# Patient Record
Sex: Male | Born: 1990 | Race: White | Hispanic: No | Marital: Married | Smoking: Current every day smoker
Health system: Southern US, Community
[De-identification: ages and names within clinical notes are randomized; demographics above are authoritative.]

## PROBLEM LIST (undated history)

## (undated) DIAGNOSIS — F1911 Other psychoactive substance abuse, in remission: Secondary | ICD-10-CM

## (undated) DIAGNOSIS — E8801 Alpha-1-antitrypsin deficiency: Secondary | ICD-10-CM

## (undated) HISTORY — DX: Alpha-1-antitrypsin deficiency: E88.01

## (undated) HISTORY — DX: Other psychoactive substance abuse, in remission: F19.11

---

## 2016-08-06 ENCOUNTER — Emergency Department (HOSPITAL_COMMUNITY): Payer: Medicaid Other

## 2016-08-06 ENCOUNTER — Encounter (HOSPITAL_COMMUNITY): Payer: Self-pay

## 2016-08-06 DIAGNOSIS — R05 Cough: Secondary | ICD-10-CM | POA: Diagnosis not present

## 2016-08-06 DIAGNOSIS — F17210 Nicotine dependence, cigarettes, uncomplicated: Secondary | ICD-10-CM | POA: Insufficient documentation

## 2016-08-06 DIAGNOSIS — Z5321 Procedure and treatment not carried out due to patient leaving prior to being seen by health care provider: Secondary | ICD-10-CM | POA: Diagnosis not present

## 2016-08-06 NOTE — ED Triage Notes (Signed)
Patient reports of cough x4 days. Denies fever. Worse when patient lays down and at work. Patient is cook.

## 2016-08-07 ENCOUNTER — Emergency Department (HOSPITAL_COMMUNITY)
Admission: EM | Admit: 2016-08-07 | Discharge: 2016-08-07 | Disposition: A | Discharge status: Home / Self Care | Payer: Medicaid Other | Attending: Dermatology | Admitting: Dermatology

## 2017-04-22 ENCOUNTER — Encounter: Payer: Self-pay | Admitting: Gastroenterology

## 2017-06-19 ENCOUNTER — Encounter: Payer: Self-pay | Admitting: *Deleted

## 2017-06-19 ENCOUNTER — Encounter: Payer: Self-pay | Admitting: Nurse Practitioner

## 2017-06-19 ENCOUNTER — Telehealth: Payer: Self-pay | Admitting: *Deleted

## 2017-06-19 ENCOUNTER — Encounter: Payer: Self-pay | Admitting: Gastroenterology

## 2017-06-19 ENCOUNTER — Ambulatory Visit (INDEPENDENT_AMBULATORY_CARE_PROVIDER_SITE_OTHER): Payer: Medicaid Other | Admitting: Nurse Practitioner

## 2017-06-19 DIAGNOSIS — R768 Other specified abnormal immunological findings in serum: Secondary | ICD-10-CM | POA: Insufficient documentation

## 2017-06-19 DIAGNOSIS — Z87898 Personal history of other specified conditions: Secondary | ICD-10-CM

## 2017-06-19 DIAGNOSIS — F1911 Other psychoactive substance abuse, in remission: Secondary | ICD-10-CM | POA: Insufficient documentation

## 2017-06-19 NOTE — Telephone Encounter (Signed)
Checked [employer]'s website and no PA is needed for elastography.

## 2017-06-19 NOTE — Progress Notes (Signed)
Primary Care Physician:  Tylene Fantasia., PA-C Primary Gastroenterologist:  Dr. Darrick Penna  Chief Complaint  Patient presents with  . Hepatitis C    HPI:   Craig Lester is a 27 y.o. male who presents on referral from primary care for positive hepatitis C antibody.  Reviewed PCP note included with his documentation which is completed 04/04/2017.  At that time he admitted that he tested positive for hepatitis C at the plasma bank in [place].  He admits to former IV drug use but currently undergoing treatment with Suboxone by Dr. Cathey Endow in [place].  He received free hepatitis C and HIV testing but does not have a copy of those results.  He was generally asymptomatic and requested referral to gastroenterology for treatment options.  He indicates his wife is also hepatitis C positive.  No labs included.  Per PCP encounter hepatitis C confirmed by [facility] health department communicable diseases department.  Today he states he's doing well overall. His wife tested positive for HCV but RNA was negative (indicates false positive vs. Spontaneous virus clearing.) He was told he tested HCV+ at Plasma center in [facility]. Denies abdominal pain, N/V, hematochezia, melena, fever, chills, unintentional weight loss. Denies yellowing of skin/eyes, darkened urine, acute episodic confusion, tremors/shakes, LE edema, abdominal swelling. Denies chest pain, dyspnea, dizziness, lightheadedness, syncope, near syncope. Denies any other upper or lower GI symptoms.  He was on Suboxone previously but stopped going because he had stopped using for over a year and felt he no longer needed treatment.  No currently on any medications.  Hepatitis C Risk Factors:  Birth cohort (1944 - 1964): No IV drug use: Yes (previously); last use >1 year ago Tattoos: Yes (all done in "homemade" fashion without sterile technique Blood product transfusion: No HC worker: No Hemodialysis: No Maternal infection:  No  A minimum of 30 minutes was spent with the patient of which at least 50% was spent on education and care coordination.  Past Medical History:  Diagnosis Date  . Alpha-1-antitrypsin deficiency (HCC)    Currently asymptomatic  . History of drug abuse     History reviewed. No pertinent surgical history.  No current outpatient medications on file.   No current facility-administered medications for this visit.     Allergies as of 06/19/2017  . (No Known Allergies)    Family History  Problem Relation Age of Onset  . Colon cancer Neg Hx   . Gastric cancer Neg Hx   . Esophageal cancer Neg Hx   . Liver disease Neg Hx     Social History   Socioeconomic History  . Marital status: Married    Spouse name: Not on file  . Number of children: Not on file  . Years of education: Not on file  . Highest education level: Not on file  Social Needs  . Financial resource strain: Not on file  . Food insecurity - worry: Not on file  . Food insecurity - inability: Not on file  . Transportation needs - medical: Not on file  . Transportation needs - non-medical: Not on file  Occupational History  . Not on file  Tobacco Use  . Smoking status: Current Every Day Smoker    Packs/day: 0.20    Types: Cigarettes  . Smokeless tobacco: Never Used  Substance and Sexual Activity  . Alcohol use: Yes    Comment: once per week: Two 24oz bottles beer  . Drug use: No    Comment: None  since approx 2017  . Sexual activity: Not on file  Other Topics Concern  . Not on file  Social History Narrative  . Not on file    Review of Systems: Complete ROS negative except as per HPI.    Physical Exam: BP (!) 99/59   Pulse (!) 59   Temp (!) 97.3 F (36.3 C) (Oral)   Ht 5\' 4"  (1.626 m)   Wt 125 lb 6.4 oz (56.9 kg)   BMI 21.52 kg/m  General:   Alert and oriented. Pleasant and cooperative. Well-nourished and well-developed.  Eyes:  Without icterus, sclera clear and conjunctiva pink.  Ears:   Normal auditory acuity. Cardiovascular:  S1, S2 present without murmurs appreciated. Extremities without clubbing or edema. Respiratory:  Clear to auscultation bilaterally. No wheezes, rales, or rhonchi. No distress.  Gastrointestinal:  +BS, soft, non-tender and non-distended. No HSM noted. No guarding or rebound. No masses appreciated.  Rectal:  Deferred  Musculoskalatal:  Symmetrical without gross deformities. Neurologic:  Alert and oriented x4;  grossly normal neurologically. Psych:  Alert and cooperative. Normal mood and affect. Heme/Lymph/Immune: No excessive bruising noted.    06/19/2017 2:29 PM   Disclaimer: This note was dictated with voice recognition software. Similar sounding words can inadvertently be transcribed and may not be corrected upon review.

## 2017-06-19 NOTE — Patient Instructions (Signed)
1. Have your blood work drawn when you are able to. 2. We will help schedule your ultrasound of your liver. 3. Return for follow-up in 6-8 weeks. 4. Call if you have any questions or concerns.

## 2017-06-19 NOTE — Assessment & Plan Note (Signed)
History of polysubstance abuse including IV drugs.  He has not had any drugs in over a year.  He was previously treated with Suboxone but is no longer attending the clinic as he feels he does not need further treatment.  Underscored the importance of no longer using IV drugs to prevent reinfection.  He verbalized understanding.

## 2017-06-19 NOTE — Assessment & Plan Note (Signed)
Positive hepatitis C antibody.  His wife has hepatitis C but her RNA test was negative, potentially cleared infection spontaneously.  We will continue with hepatitis C workup on this patient including CBC, CMP, INR, hepatitis C RNA, hepatitis C genotype, hepatitis B core antibody/surface antibody/surface antigen, HIV.  We will also check ultrasound elastography of his liver for fibrosis/cirrhosis.  Return for follow-up in 6-8 weeks.  Pending his labs we can submit for prior Auth of hepatitis C treatment.  He appears to be an intelligent individual and nothing with my discussion with him indicates noncompliance with medication treatment.

## 2017-06-20 NOTE — Progress Notes (Signed)
CC'D TO PCP °

## 2017-06-25 ENCOUNTER — Ambulatory Visit (HOSPITAL_COMMUNITY): Admission: RE | Admit: 2017-06-25 | Payer: Medicaid Other | Source: Ambulatory Visit

## 2017-06-27 ENCOUNTER — Ambulatory Visit (HOSPITAL_COMMUNITY)
Admission: RE | Admit: 2017-06-27 | Discharge: 2017-06-27 | Disposition: A | Discharge status: Home / Self Care | Payer: Medicaid Other | Source: Ambulatory Visit | Attending: Nurse Practitioner | Admitting: Nurse Practitioner

## 2017-06-27 DIAGNOSIS — Z87898 Personal history of other specified conditions: Secondary | ICD-10-CM | POA: Diagnosis present

## 2017-06-27 DIAGNOSIS — R768 Other specified abnormal immunological findings in serum: Secondary | ICD-10-CM | POA: Diagnosis not present

## 2017-06-27 DIAGNOSIS — F1911 Other psychoactive substance abuse, in remission: Secondary | ICD-10-CM

## 2017-06-27 NOTE — Progress Notes (Signed)
Forwarding to [employer]an and Stacey.

## 2017-06-27 NOTE — Progress Notes (Signed)
Pt is aware and would like to see Minerva Areolaric before the end of Feb if at all possible. His appt is for March.

## 2017-07-01 NOTE — Progress Notes (Signed)
Added patient to a waitlist.

## 2017-07-02 NOTE — Progress Notes (Signed)
LMOM for a return call. The genotype and RNA are pending. I spoke to [place] at the lab, customer service. She said sometimes it can take up to 2 weeks for those to finalize.

## 2017-07-02 NOTE — Progress Notes (Signed)
PATIENT IS ON A CANCELLATION LIST

## 2017-07-03 NOTE — Progress Notes (Signed)
Left message for a return call

## 2017-07-09 NOTE — Progress Notes (Signed)
I spoke with Craig Lester in customer service. She said they are saying it resulted on 07/04/2017, but is now showing. She is checking it out and will fax to me, she will call me back if any further problems.

## 2017-07-10 NOTE — Progress Notes (Signed)
Received the Genotype result via fax. Placing on Craig Lester's desk for review.

## 2017-07-11 LAB — COMPREHENSIVE METABOLIC PANEL
AG RATIO: 2.1 (calc) (ref 1.0–2.5)
ALKALINE PHOSPHATASE (APISO): 58 U/L (ref 40–115)
ALT: 23 U/L (ref 9–46)
AST: 28 U/L (ref 10–40)
Albumin: 5.3 g/dL — ABNORMAL HIGH (ref 3.6–5.1)
BILIRUBIN TOTAL: 1.9 mg/dL — AB (ref 0.2–1.2)
BUN: 16 mg/dL (ref 7–25)
CALCIUM: 10.4 mg/dL — AB (ref 8.6–10.3)
CHLORIDE: 105 mmol/L (ref 98–110)
CO2: 28 mmol/L (ref 20–32)
Creat: 0.9 mg/dL (ref 0.60–1.35)
GLOBULIN: 2.5 g/dL (ref 1.9–3.7)
GLUCOSE: 74 mg/dL (ref 65–99)
Potassium: 4.2 mmol/L (ref 3.5–5.3)
Sodium: 140 mmol/L (ref 135–146)
Total Protein: 7.8 g/dL (ref 6.1–8.1)

## 2017-07-11 LAB — PROTIME-INR
INR: 1.1
PROTHROMBIN TIME: 11.2 s (ref 9.0–11.5)

## 2017-07-11 LAB — CBC WITH DIFFERENTIAL/PLATELET
BASOS PCT: 0.8 %
Basophils Absolute: 51 cells/uL (ref 0–200)
EOS PCT: 2 %
Eosinophils Absolute: 128 cells/uL (ref 15–500)
HCT: 40.2 % (ref 38.5–50.0)
Hemoglobin: 13.9 g/dL (ref 13.2–17.1)
Lymphs Abs: 2483 cells/uL (ref 850–3900)
MCH: 29.5 pg (ref 27.0–33.0)
MCHC: 34.6 g/dL (ref 32.0–36.0)
MCV: 85.4 fL (ref 80.0–100.0)
MONOS PCT: 8 %
MPV: 10 fL (ref 7.5–12.5)
NEUTROS PCT: 50.4 %
Neutro Abs: 3226 cells/uL (ref 1500–7800)
PLATELETS: 223 10*3/uL (ref 140–400)
RBC: 4.71 10*6/uL (ref 4.20–5.80)
RDW: 12.8 % (ref 11.0–15.0)
TOTAL LYMPHOCYTE: 38.8 %
WBC mixed population: 512 cells/uL (ref 200–950)
WBC: 6.4 10*3/uL (ref 3.8–10.8)

## 2017-07-11 LAB — HEPATITIS C GENOTYPE

## 2017-07-11 LAB — HCV RNA,QN,PCR W/REFL TO GENOTYPE, LIPA(R)
HCV RNA, PCR, QN (Log): 6.55 log IU/mL — ABNORMAL HIGH
HCV RNA, PCR, QN: 3570000 [IU]/mL — AB

## 2017-07-11 LAB — HEPATITIS C AB W/RFL RNA, PCR + GENO
Hepatitis C Ab: REACTIVE — AB
SIGNAL TO CUT-OFF: 20.4 — ABNORMAL HIGH (ref <1.00)

## 2017-07-11 LAB — HEPATITIS B CORE ANTIBODY, TOTAL: Hep B Core Total Ab: NONREACTIVE

## 2017-07-11 LAB — HEPATITIS B SURFACE ANTIBODY,QUALITATIVE: Hep B S Ab: NONREACTIVE

## 2017-07-11 LAB — HEPATITIS B SURFACE ANTIGEN: Hepatitis B Surface Ag: NONREACTIVE

## 2017-07-11 LAB — HIV ANTIBODY (ROUTINE TESTING W REFLEX): HIV: NONREACTIVE

## 2017-07-11 NOTE — Progress Notes (Signed)
Pt is aware of all results and plan. He will keep appt in early March. I am mailing the order for the Hep B series and he is aware if PCP does not do the shots that [facility].

## 2017-07-17 ENCOUNTER — Encounter: Payer: Self-pay | Admitting: Nurse Practitioner

## 2017-07-17 ENCOUNTER — Ambulatory Visit: Payer: Medicaid Other | Admitting: Nurse Practitioner

## 2017-07-17 VITALS — BP 101/52 | HR 59 | Temp 97.4°F | Ht 65.0 in | Wt 124.4 lb

## 2017-07-17 DIAGNOSIS — Z87898 Personal history of other specified conditions: Secondary | ICD-10-CM

## 2017-07-17 DIAGNOSIS — F1911 Other psychoactive substance abuse, in remission: Secondary | ICD-10-CM

## 2017-07-17 DIAGNOSIS — B182 Chronic viral hepatitis C: Secondary | ICD-10-CM | POA: Diagnosis not present

## 2017-07-17 DIAGNOSIS — Z8639 Personal history of other endocrine, nutritional and metabolic disease: Secondary | ICD-10-CM

## 2017-07-17 NOTE — Progress Notes (Signed)
Referring Provider: Tylene Fantasia., PA-C Primary Care Physician:  Tylene Fantasia., PA-C Primary GI:  Dr. Darrick Penna  Chief Complaint  Patient presents with  . Hepatitis C    HPI:   Craig Lester is a 27 y.o. male who presents for 1 hepatitis C.  The patient was last seen in our office 06/19/2017 for positive hepatitis C and history of drug abuse.  His last visit he admitted former IV drug use but currently undergoing treatment with Suboxone.  His wife is also hepatitis C positive.  At his last visit he was doing well overall, his wife tested positive for hepatitis C antibody but RNA was negative indicating false positive versus spontaneous viral clearing.  His positive antibody was at the plasma center in [place].  Denies any overt GI symptoms or hepatic symptoms.  Was previously on Suboxone but stopped going because he had stopped using for over 1 year and felt he no longer needed treatment.  Recommended serological workup, right upper quadrant ultrasound elastography, follow-up in 6-8 weeks.  Labs are completed 06/27/2017.  CBC was normal, CMP demonstrated elevated bilirubin at 1.9 otherwise normal LFTs, kidney function, electrolytes.  INR normal at 1.1.  HIV negative.  Hepatitis B surface antibody, core antibody, surface antigen all negative indicating susceptible to hepatitis B infection.  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  U/S elastography completed 06/27/17 which found F0/F1 (low fisk of fibrosis). Commended keep follow-up appointment to discuss treatment options.  Today he states he's doing well overall. Deniss abdominal pain, N/V, hematochezia, melena, unintentional weight loss, fever, chills. Denies yellowing of skin/eyes, darkened urine, acute episodic confusion, tremors/shakes. Last IV drug use was over a year ago. Occasional ETOH use (2 times a week consisting of with 1-2 beers). Denies chest pain, dyspnea, dizziness,  lightheadedness, syncope, near syncope. Denies any other upper or lower GI symptoms.  History of Alpha-1-Antitrypsin deficiency at a location in [place], can't remember. Was tested at age 40.  Past Medical History:  Diagnosis Date  . Alpha-1-antitrypsin deficiency (HCC)    Currently asymptomatic  . History of drug abuse     History reviewed. No pertinent surgical history.  No current outpatient medications on file.   No current facility-administered medications for this visit.     Allergies as of 07/17/2017  . (No Known Allergies)    Family History  Problem Relation Age of Onset  . Colon cancer Neg Hx   . Gastric cancer Neg Hx   . Esophageal cancer Neg Hx   . Liver disease Neg Hx     Social History   Socioeconomic History  . Marital status: Married    Spouse name: None  . Number of children: None  . Years of education: None  . Highest education level: None  Social Needs  . Financial resource strain: None  . Food insecurity - worry: None  . Food insecurity - inability: None  . Transportation needs - medical: None  . Transportation needs - non-medical: None  Occupational History  . None  Tobacco Use  . Smoking status: Current Every Day Smoker    Packs/day: 0.10    Types: Cigarettes  . Smokeless tobacco: Never Used  . Tobacco comment: Trying to quit smoking  Substance and Sexual Activity  . Alcohol use: Yes    Comment: twice per week  . Drug use: No    Comment: None since approx 2017  . Sexual activity: None  Other Topics  Concern  . None  Social History Narrative  . None    Review of Systems: Complete ROS negative except as per HPI.   Physical Exam: BP (!) 101/52   Pulse (!) 59   Temp (!) 97.4 F (36.3 C) (Oral)   Ht 5\' 5"  (1.651 m)   Wt 124 lb 6.4 oz (56.4 kg)   BMI 20.70 kg/m  General:   Alert and oriented. Pleasant and cooperative. Well-nourished and well-developed.  Eyes:  Without icterus, sclera clear and conjunctiva pink.  Ears:  Normal  auditory acuity. Cardiovascular:  S1, S2 present without murmurs appreciated. Extremities without clubbing or edema. Respiratory:  Clear to auscultation bilaterally. No wheezes, rales, or rhonchi. No distress.  Gastrointestinal:  +BS, soft, non-tender and non-distended. No HSM noted. No guarding or rebound. No masses appreciated.  Rectal:  Deferred  Musculoskalatal:  Symmetrical without gross deformities. Neurologic:  Alert and oriented x4;  grossly normal neurologically. Psych:  Alert and cooperative. Normal mood and affect.    07/17/2017 10:31 AM   Disclaimer: This note was dictated with voice recognition software. Similar sounding words can inadvertently be transcribed and may not be corrected upon review.

## 2017-07-17 NOTE — Assessment & Plan Note (Signed)
The patient's past medical history includes alpha-1 antitrypsin deficiency.  He states he was tested by his mom at an unknown location when he was 27 years old.  They told him he has to deficiency but no symptoms.  His CMP did have elevated bilirubin at 1.9, other liver enzymes within normal limits.  I will recheck his CMP today.  I will also check alpha-1 antitrypsin enzyme levels and genotyping to have definitive answer on his diagnosis.  He may necessitate ongoing liver monitoring pending results.  He may eventually require liver biopsy specialized staining if his symptoms are labs change.  Return for follow-up 4 weeks after starting hepatitis C treatment.

## 2017-07-17 NOTE — Assessment & Plan Note (Signed)
3 of drug use he has been clean for almost 2 years.  Recommend he continue to abstain from drug use.  Recommend abstinence from alcohol as well.  Return for follow-up 4 weeks post initiation of hepatitis C treatment.

## 2017-07-17 NOTE — Assessment & Plan Note (Signed)
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  He is genotype 1A.  Approximately 3.75 million copies.  His CMP was essentially normal other than elevated bilirubin at 1.9.  He does have a history, per the patient, of alpha-1 antitrypsin deficiency.  We will retest for this as per above as it is been approximately 10 years since she was tested as a juvenile.  Records of this testing is not available to us.  Fortunately, his ultrasound elastography demonstrates F0/F1 with minimal to no scarring.  I feel he is not a great candidate for treatment.  We will begin prior authorization for treatment through his insurance and/or patient assistance.  Recommend Harvoni for 12 weeks.  Follow-up in 4 weeks after starting treatment for follow-up on treatment status as well as labs.

## 2017-07-17 NOTE — Patient Instructions (Signed)
1. Have your blood test done when you are able to. 2. We will submit a request for treatment to your insurance. 3. We will work with your insurance and/or the medication company to get your treatment covered. 4. Return for follow-up 4 weeks after you start your treatment.  We will call to schedule this over the phone once her medication is received and you pick it up. 5. Continue to abstain from drugs.  Abstain from alcohol. 6. Call us if you have any questions or concerns.

## 2017-07-17 NOTE — Progress Notes (Signed)
CC'D TO PCP °

## 2017-07-18 ENCOUNTER — Telehealth: Payer: Self-pay

## 2017-07-18 NOTE — Telephone Encounter (Signed)
EG advised to wait until March to start process for Harvoni through pt assistance. His Medicaid will run out at the end of this month and they will not approve for Harvoni. Called and informed pt.

## 2017-07-18 NOTE — Telephone Encounter (Signed)
EG spoke to MedonDebbie with pt assistance. She advised to try Epclusa. EG advised to call pt to get some info. Called pt. He is married. His wife doesn't work. States his Medicaid was good for 1 year and they will be switching to "family planning". He received letter about Medicaid running out but his wife threw it away. Advised pt to call phone number on Medicaid card to see if he can get another letter. Advised him to bring letter to our office and proof of his income (ie tax return, last 2 pay stubs) so we can start paperwork for patient assistance program in March.

## 2017-07-23 LAB — HEPATIC FUNCTION PANEL
AG Ratio: 2.1 (calc) (ref 1.0–2.5)
ALKALINE PHOSPHATASE (APISO): 60 U/L (ref 40–115)
ALT: 21 U/L (ref 9–46)
AST: 23 U/L (ref 10–40)
Albumin: 4.8 g/dL (ref 3.6–5.1)
BILIRUBIN DIRECT: 0.3 mg/dL — AB (ref 0.0–0.2)
BILIRUBIN INDIRECT: 1.1 mg/dL (ref 0.2–1.2)
Globulin: 2.3 g/dL (calc) (ref 1.9–3.7)
TOTAL PROTEIN: 7.1 g/dL (ref 6.1–8.1)
Total Bilirubin: 1.4 mg/dL — ABNORMAL HIGH (ref 0.2–1.2)

## 2017-07-23 LAB — ALPHA-1 ANTITRYPSIN PHENOTYPE: A1 ANTITRYPSIN SER: 81 mg/dL — AB (ref 83–199)

## 2017-08-07 ENCOUNTER — Ambulatory Visit: Payer: Medicaid Other | Admitting: Nurse Practitioner

## 2017-08-22 ENCOUNTER — Other Ambulatory Visit: Payer: Self-pay

## 2017-09-04 ENCOUNTER — Telehealth: Payer: Self-pay | Admitting: Gastroenterology

## 2017-09-04 NOTE — Telephone Encounter (Signed)
[phone] patient called inquiring about when he was supposed to come back in for an office visit, he was also supposed to speak with someone about patient assistance for the medication he was supposed to start

## 2017-09-04 NOTE — Telephone Encounter (Signed)
Forwarding to [place] who was assisting pt with Harvoni.

## 2017-09-05 NOTE — Telephone Encounter (Signed)
Pt assistance forms for Epclusa mailed to pt. Called and informed him. He can bring completed forms and documents to our office or send in mail.

## 2017-09-05 NOTE — Telephone Encounter (Signed)
Pt called office yesterday. Pt assistance paperwork mailed to pt. Called and informed him. He can bring completed paperwork and documents to our office or send in mail.

## 2021-01-06 ENCOUNTER — Emergency Department (HOSPITAL_COMMUNITY): Payer: Self-pay

## 2021-01-06 ENCOUNTER — Encounter (HOSPITAL_COMMUNITY): Payer: Self-pay

## 2021-01-06 ENCOUNTER — Emergency Department (HOSPITAL_COMMUNITY)
Admission: EM | Admit: 2021-01-06 | Discharge: 2021-01-06 | Disposition: A | Discharge status: Home / Self Care | Payer: Self-pay | Attending: Emergency Medicine | Admitting: Emergency Medicine

## 2021-01-06 ENCOUNTER — Other Ambulatory Visit: Payer: Self-pay

## 2021-01-06 DIAGNOSIS — R109 Unspecified abdominal pain: Secondary | ICD-10-CM | POA: Insufficient documentation

## 2021-01-06 DIAGNOSIS — R0781 Pleurodynia: Secondary | ICD-10-CM | POA: Insufficient documentation

## 2021-01-06 DIAGNOSIS — F17210 Nicotine dependence, cigarettes, uncomplicated: Secondary | ICD-10-CM | POA: Insufficient documentation

## 2021-01-06 LAB — COMPREHENSIVE METABOLIC PANEL
ALT: 121 U/L — ABNORMAL HIGH (ref 0–44)
AST: 120 U/L — ABNORMAL HIGH (ref 15–41)
Albumin: 4.7 g/dL (ref 3.5–5.0)
Alkaline Phosphatase: 70 U/L (ref 38–126)
Anion gap: 10 (ref 5–15)
BUN: 10 mg/dL (ref 6–20)
CO2: 24 mmol/L (ref 22–32)
Calcium: 9 mg/dL (ref 8.9–10.3)
Chloride: 105 mmol/L (ref 98–111)
Creatinine, Ser: 1.02 mg/dL (ref 0.61–1.24)
GFR, Estimated: >60 mL/min (ref >60)
Glucose, Bld: 135 mg/dL — ABNORMAL HIGH (ref 70–99)
Potassium: 3.8 mmol/L (ref 3.5–5.1)
Sodium: 139 mmol/L (ref 135–145)
Total Bilirubin: 1.1 mg/dL (ref 0.3–1.2)
Total Protein: 8.6 g/dL — ABNORMAL HIGH (ref 6.5–8.1)

## 2021-01-06 LAB — CBC
HCT: 47.5 % (ref 39.0–52.0)
Hemoglobin: 15.9 g/dL (ref 13.0–17.0)
MCH: 33.3 pg (ref 26.0–34.0)
MCHC: 33.5 g/dL (ref 30.0–36.0)
MCV: 99.4 fL (ref 80.0–100.0)
Platelets: 215 10*3/uL (ref 150–400)
RBC: 4.78 MIL/uL (ref 4.22–5.81)
RDW: 14.5 % (ref 11.5–15.5)
WBC: 6.8 10*3/uL (ref 4.0–10.5)
nRBC: 0 % (ref 0.0–0.2)

## 2021-01-06 LAB — URINALYSIS, ROUTINE W REFLEX MICROSCOPIC
Bilirubin Urine: NEGATIVE
Glucose, UA: NEGATIVE mg/dL
Hgb urine dipstick: NEGATIVE
Ketones, ur: NEGATIVE mg/dL
Leukocytes,Ua: NEGATIVE
Nitrite: NEGATIVE
Protein, ur: NEGATIVE mg/dL
Specific Gravity, Urine: >1.046 — ABNORMAL HIGH (ref 1.005–1.030)
pH: 6 (ref 5.0–8.0)

## 2021-01-06 LAB — TROPONIN I (HIGH SENSITIVITY)
Troponin I (High Sensitivity): 4 ng/L (ref <18)
Troponin I (High Sensitivity): 6 ng/L (ref <18)

## 2021-01-06 MED ORDER — KETOROLAC TROMETHAMINE 15 MG/ML IJ SOLN
15.0000 mg | Freq: Once | INTRAMUSCULAR | Status: AC
  Administered 2021-01-06: 15 mg via INTRAVENOUS
  Filled 2021-01-06: qty 1

## 2021-01-06 MED ORDER — HYDROMORPHONE HCL 1 MG/ML IJ SOLN
0.5000 mg | Freq: Once | INTRAMUSCULAR | Status: AC
  Administered 2021-01-06: 0.5 mg via INTRAVENOUS
  Filled 2021-01-06: qty 1

## 2021-01-06 MED ORDER — SODIUM CHLORIDE 0.9 % IV BOLUS
1000.0000 mL | Freq: Once | INTRAVENOUS | Status: AC
  Administered 2021-01-06: 1000 mL via INTRAVENOUS

## 2021-01-06 MED ORDER — IOHEXOL 350 MG/ML SOLN
100.0000 mL | Freq: Once | INTRAVENOUS | Status: AC | PRN
  Administered 2021-01-06: 100 mL via INTRAVENOUS

## 2021-01-06 MED ORDER — NAPROXEN 500 MG PO TABS: 500.0000 mg | ORAL_TABLET | Freq: Two times a day (BID) | ORAL | 0 refills | Status: AC

## 2021-01-06 NOTE — ED Provider Notes (Signed)
[facility]-EMERGENCY DEPT [facility] Emergency Department Provider Note MRN:  [id number]  Arrival date & time: 01/06/21     Chief Complaint   Flank pain History of Present Illness   Craig Lester is a 30 y.o. year-old male with a history of alpha-1 antitrypsin deficiency presenting to the ED with chief complaint of flank pain.  Patient is endorsing gradual onset left flank pain for the past 24 hours.  Becoming progressively worse, constant, currently 6 out of 10.  Pain is located in the anterior left ribs, left lateral ribs, as well as the left flank.  Pain is much worse with deep breaths.  Denies headache or vision change, no shortness of breath, no abdominal pain, no dysuria, no hematuria, no fever.  Review of Systems  A complete 10 system review of systems was obtained and all systems are negative except as noted in the HPI and PMH.   Patient's Health History    Past Medical History:  Diagnosis Date   Alpha-1-antitrypsin deficiency [facility])    Currently asymptomatic   History of drug abuse (HCC)     History reviewed. No pertinent surgical history.  Family History  Problem Relation Age of Onset   Colon cancer Neg Hx    Gastric cancer Neg Hx    Esophageal cancer Neg Hx    Liver disease Neg Hx     Social History   Socioeconomic History   Marital status: Married    Spouse name: Not on file   Number of children: Not on file   Years of education: Not on file   Highest education level: Not on file  Occupational History   Not on file  Tobacco Use   Smoking status: Every Day    Packs/day: 0.10    Types: Cigarettes   Smokeless tobacco: Never   Tobacco comments:    Trying to quit smoking  Substance and Sexual Activity   Alcohol use: Yes    Comment: twice per week   Drug use: No    Types: IV, Methamphetamines, Heroin    Comment: None since approx 2017   Sexual activity: Not on file  Other Topics Concern   Not on file  Social History Narrative   Not on file    Social Determinants of Health   Financial Resource Strain: Not on file  Food Insecurity: Not on file  Transportation Needs: Not on file  Physical Activity: Not on file  Stress: Not on file  Social Connections: Not on file  Intimate Partner Violence: Not on file     Physical Exam   Vitals:   01/06/21 0330 01/06/21 0400  BP: (!) 134/99 (!) 149/84  Pulse: (!) 113 (!) 103  Resp: (!) 22 19  Temp:    SpO2: 96% 100%    CONSTITUTIONAL: Well-appearing, NAD NEURO:  Alert and oriented x 3, no focal deficits EYES:  eyes equal and reactive ENT/NECK:  no LAD, no JVD CARDIO: Tachycardic rate, well-perfused, normal S1 and S2 PULM:  CTAB no wheezing or rhonchi GI/GU:  normal bowel sounds, non-distended, non-tender; left CVA tenderness MSK/SPINE:  No gross deformities, no edema SKIN:  no rash, atraumatic PSYCH:  Appropriate speech and behavior  *Additional and/or pertinent findings included in MDM below  Diagnostic and Interventional Summary    EKG Interpretation  Date/Time:  Friday January 06 2021 01:57:36 EDT Ventricular Rate:  128 PR Interval:  136 QRS Duration: 80 QT Interval:  303 QTC Calculation: 443 R Axis:   81 Text Interpretation: Sinus  tachycardia No previous ECGs available Confirmed by Kennis Carina [phone]) on 01/06/2021 2:48:46 AM       Labs Reviewed  COMPREHENSIVE METABOLIC PANEL - Abnormal; Notable for the following components:      Result Value   Glucose, Bld 135 (*)    Total Protein 8.6 (*)    AST 120 (*)    ALT 121 (*)    All other components within normal limits  URINALYSIS, ROUTINE W REFLEX MICROSCOPIC - Abnormal; Notable for the following components:   Specific Gravity, Urine >1.046 (*)    All other components within normal limits  CBC  TROPONIN I (HIGH SENSITIVITY)  TROPONIN I (HIGH SENSITIVITY)    CT RENAL STONE STUDY  Final Result    CT Angio Chest Pulmonary Embolism (PE) W or WO Contrast  Final Result    DG Chest Port 1 View  Final Result       Medications  sodium chloride 0.9 % bolus 1,000 mL (0 mLs Intravenous Stopped 01/06/21 0300)  ketorolac (TORADOL) 15 MG/ML injection 15 mg (15 mg Intravenous Given 01/06/21 0213)  HYDROmorphone (DILAUDID) injection 0.5 mg (0.5 mg Intravenous Given 01/06/21 0213)  iohexol (OMNIPAQUE) 350 MG/ML injection 100 mL (100 mLs Intravenous Contrast Given 01/06/21 0250)     Procedures  /  Critical Care Procedures  ED Course and Medical Decision Making  I have reviewed the triage vital signs, the nursing notes, and pertinent available records from the EMR.  Listed above are laboratory and imaging tests that I personally ordered, reviewed, and interpreted and then considered in my medical decision making (see below for details).  Differential diagnosis includes pneumothorax, PE, kidney stone, MSK pain.  Patient denies trauma.  No evidence of shingles.  Doubt splenic pathology.  Given the tachycardia and the abnormal location of the pain, will be difficult to exclude PE without CTA.  Starting with labs, chest x-ray, suspect will proceed with CT imaging.     Work-up is reassuring, labs normal, troponin negative, urinalysis normal, CT imaging without acute process, no kidney stone, no evidence of PE or dissection.  On reassessment patient is feeling much better, tachycardia is resolved.  Suspect MSK related pain or pleurisy, appropriate for discharge.  Elmer Sow. Pilar Plate, MD [facility] Emergency Medicine [facility] [email or web address]  Final Clinical Impressions(s) / ED Diagnoses     ICD-10-CM   1. Flank pain  R10.9       ED Discharge Orders          Ordered    naproxen (NAPROSYN) 500 MG tablet  2 times daily        01/06/21 0446             Discharge Instructions Discussed with and Provided to Patient:     Discharge Instructions      You were evaluated in the Emergency Department and after careful evaluation, we did not find any emergent condition requiring admission  or further testing in the hospital.  Your exam/testing today is overall reassuring.  Symptoms seem to be due to pleurisy.  CT scans did not show any blood clots or kidney stones or any other emergencies.  Recommend taking the Naprosyn anti-inflammatory as directed.  Please return to the Emergency Department if you experience any worsening of your condition.   Thank you for allowing [place] to be a part of your care.        Sabas Sous, MD 01/06/21 [phone]

## 2021-01-06 NOTE — Discharge Instructions (Addendum)
You were evaluated in the Emergency Department and after careful evaluation, we did not find any emergent condition requiring admission or further testing in the hospital.  Your exam/testing today is overall reassuring.  Symptoms seem to be due to pleurisy.  CT scans did not show any blood clots or kidney stones or any other emergencies.  Recommend taking the Naprosyn anti-inflammatory as directed.  Please return to the Emergency Department if you experience any worsening of your condition.   Thank you for allowing [place] to be a part of your care.

## 2021-01-06 NOTE — ED Triage Notes (Signed)
Left side and flank pain since yesterday. Thinks he has a kidney stone. Denies urinary s/s .

## 2022-01-23 IMAGING — CT CT RENAL STONE PROTOCOL
2 of 4 series · 16 of 46 positions shown, 18 images · non-contrast
Comparison: CT of the abdomen pelvis report dated 05/11/2019. The
images are not available for direct comparison.

CLINICAL DATA: Flank pain. Concern for kidney stone.

EXAM:
CT ABDOMEN AND PELVIS WITHOUT CONTRAST
TECHNIQUE: Multidetector CT imaging of the abdomen and pelvis was performed
following the standard protocol without IV contrast.

[Series 3: axial st · axial · 0.86mm/px · z∈[+678,+1098]mm · 13 of 100 slices shown, 15 images]
[im 8/100  soft-tissue]
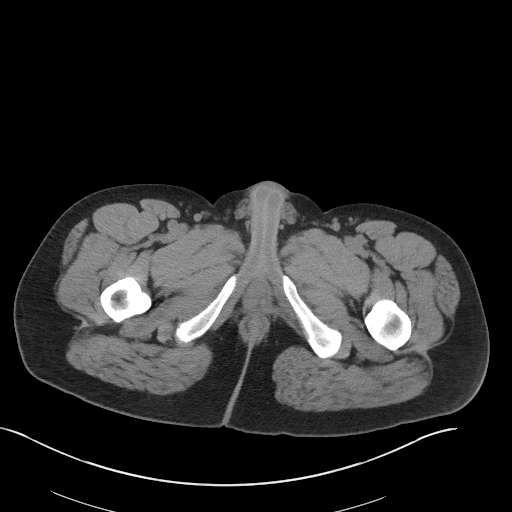
[im 8/100  bone]
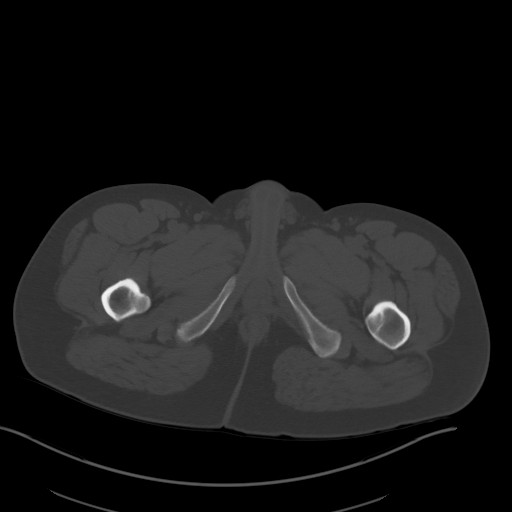
[im 15/100  soft-tissue]
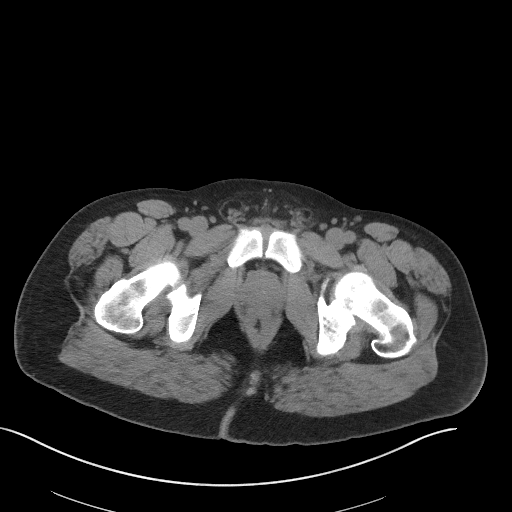
[im 22/100  soft-tissue]
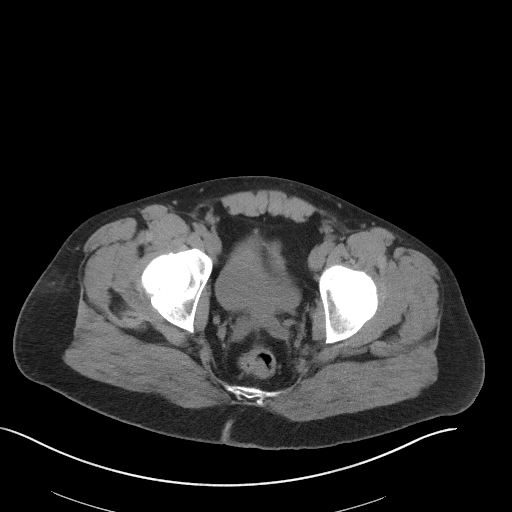
[im 29/100  soft-tissue]
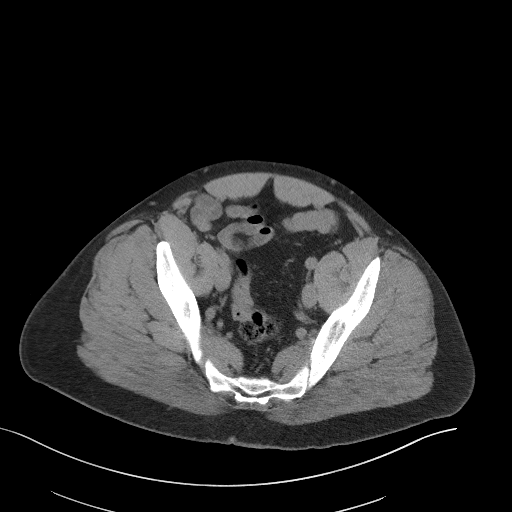
[im 36/100  soft-tissue]
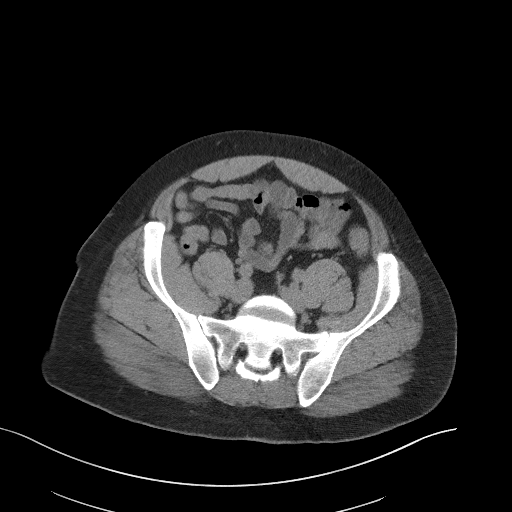
[im 43/100  soft-tissue]
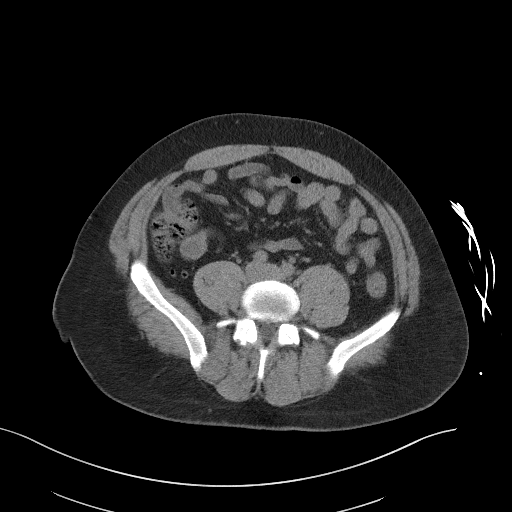
[im 50/100  soft-tissue]
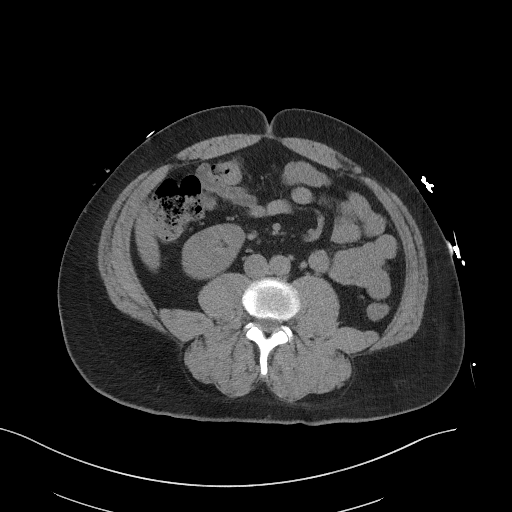
[im 57/100  soft-tissue]
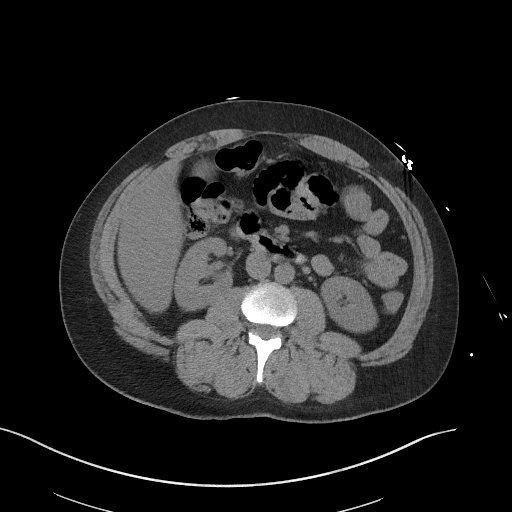
[im 64/100  soft-tissue]
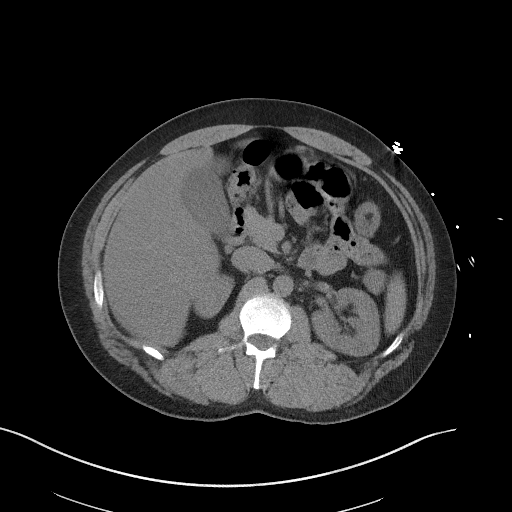
[im 64/100  bone]
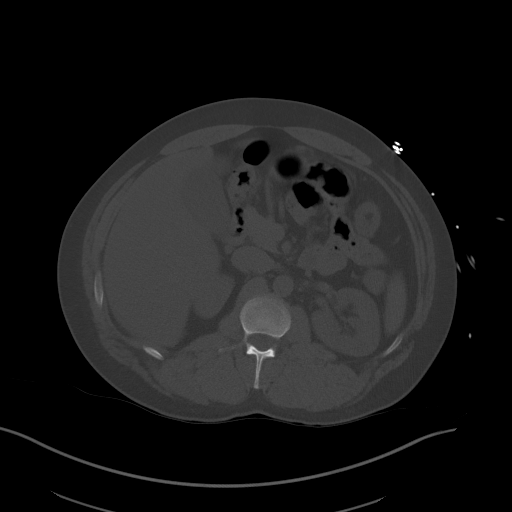
[im 71/100  soft-tissue]
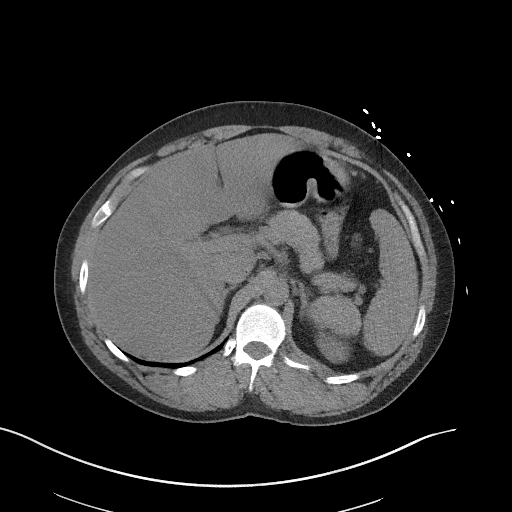
[im 78/100  soft-tissue]
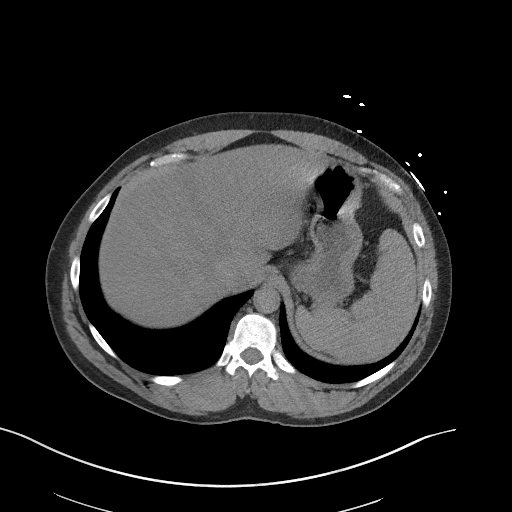
[im 85/100  soft-tissue]
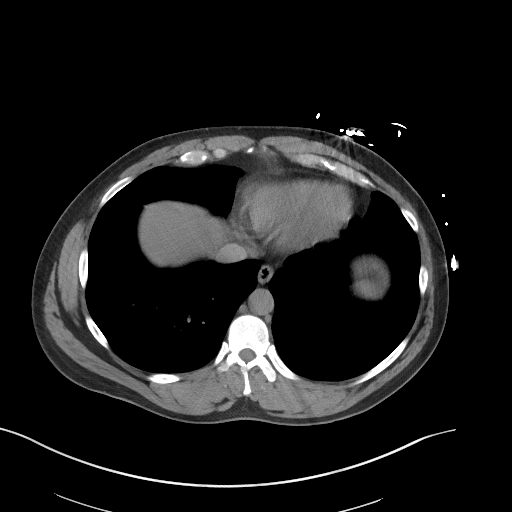
[im 92/100  soft-tissue]
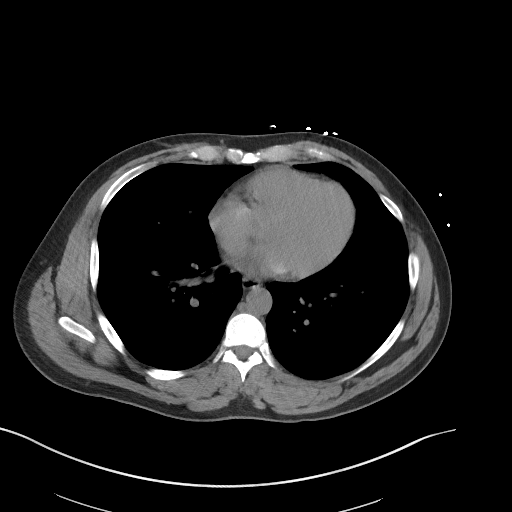

[Series 6: coronal st · coronal · 0.82mm/px · 3 of 101 slices shown]
[im 34/101  soft-tissue]
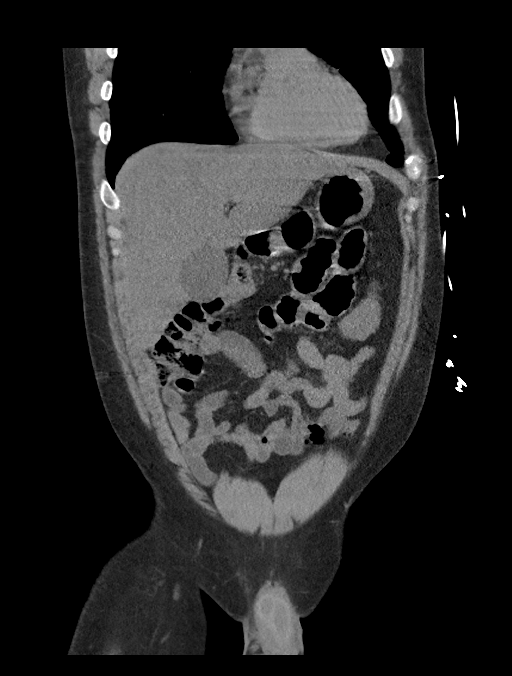
[im 45/101  soft-tissue]
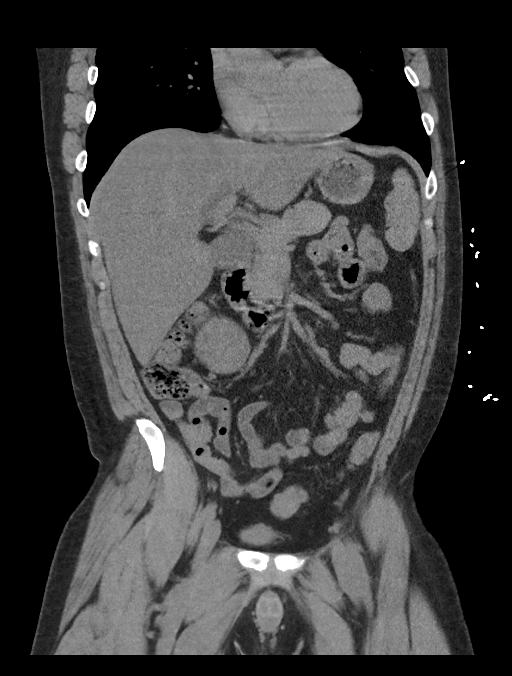
[im 56/101  soft-tissue]
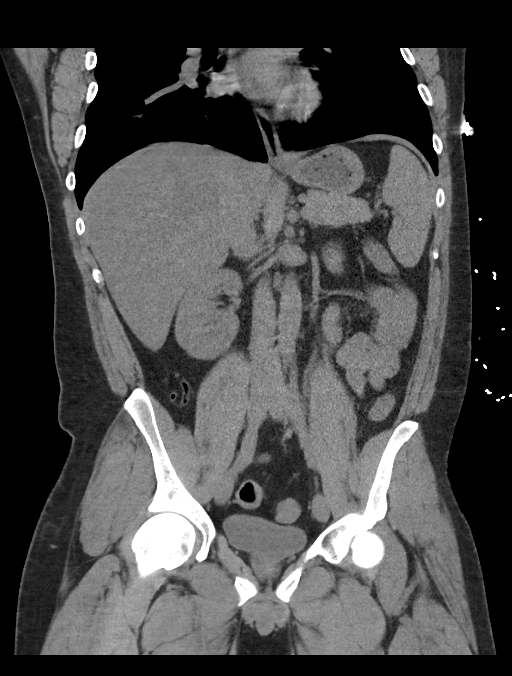

[16 of 46 positions shown; findings below may reference images not displayed]

FINDINGS: Evaluation of this exam is limited in the absence of intravenous
contrast.

Lower chest: The visualized lung bases are clear.

No intra-abdominal free air or free fluid.

Hepatobiliary: Diffuse fatty liver. No intrahepatic biliary ductal
dilatation. The gallbladder is unremarkable.

Pancreas: Unremarkable. No pancreatic ductal dilatation or
surrounding inflammatory changes.

Spleen: Normal in size without focal abnormality.

Adrenals/Urinary Tract: The adrenal glands, kidneys, visualized
ureters, and urinary bladder appear unremarkable.

Stomach/Bowel: There is no bowel obstruction or active inflammation.
The appendix is normal.

Vascular/Lymphatic: The abdominal aorta and IVC are grossly
unremarkable on this noncontrast CT. No portal venous gas. There is
no adenopathy.

Reproductive: The prostate and seminal vesicles are grossly
unremarkable. No pelvic mass.

Other: None

Musculoskeletal: Bilateral L5 pars defects. No listhesis. No acute
osseous pathology.
IMPRESSION: 1. No acute intra-abdominal or pelvic pathology. No hydronephrosis
or nephrolithiasis.
2. Fatty liver.

## 2022-01-23 IMAGING — DX DG CHEST 1V PORT
1 series · 1 of 1 positions shown · non-contrast
Comparison: 08/06/2016

CLINICAL DATA: Left rib pain

EXAM:
PORTABLE CHEST 1 VIEW

[chest ap]
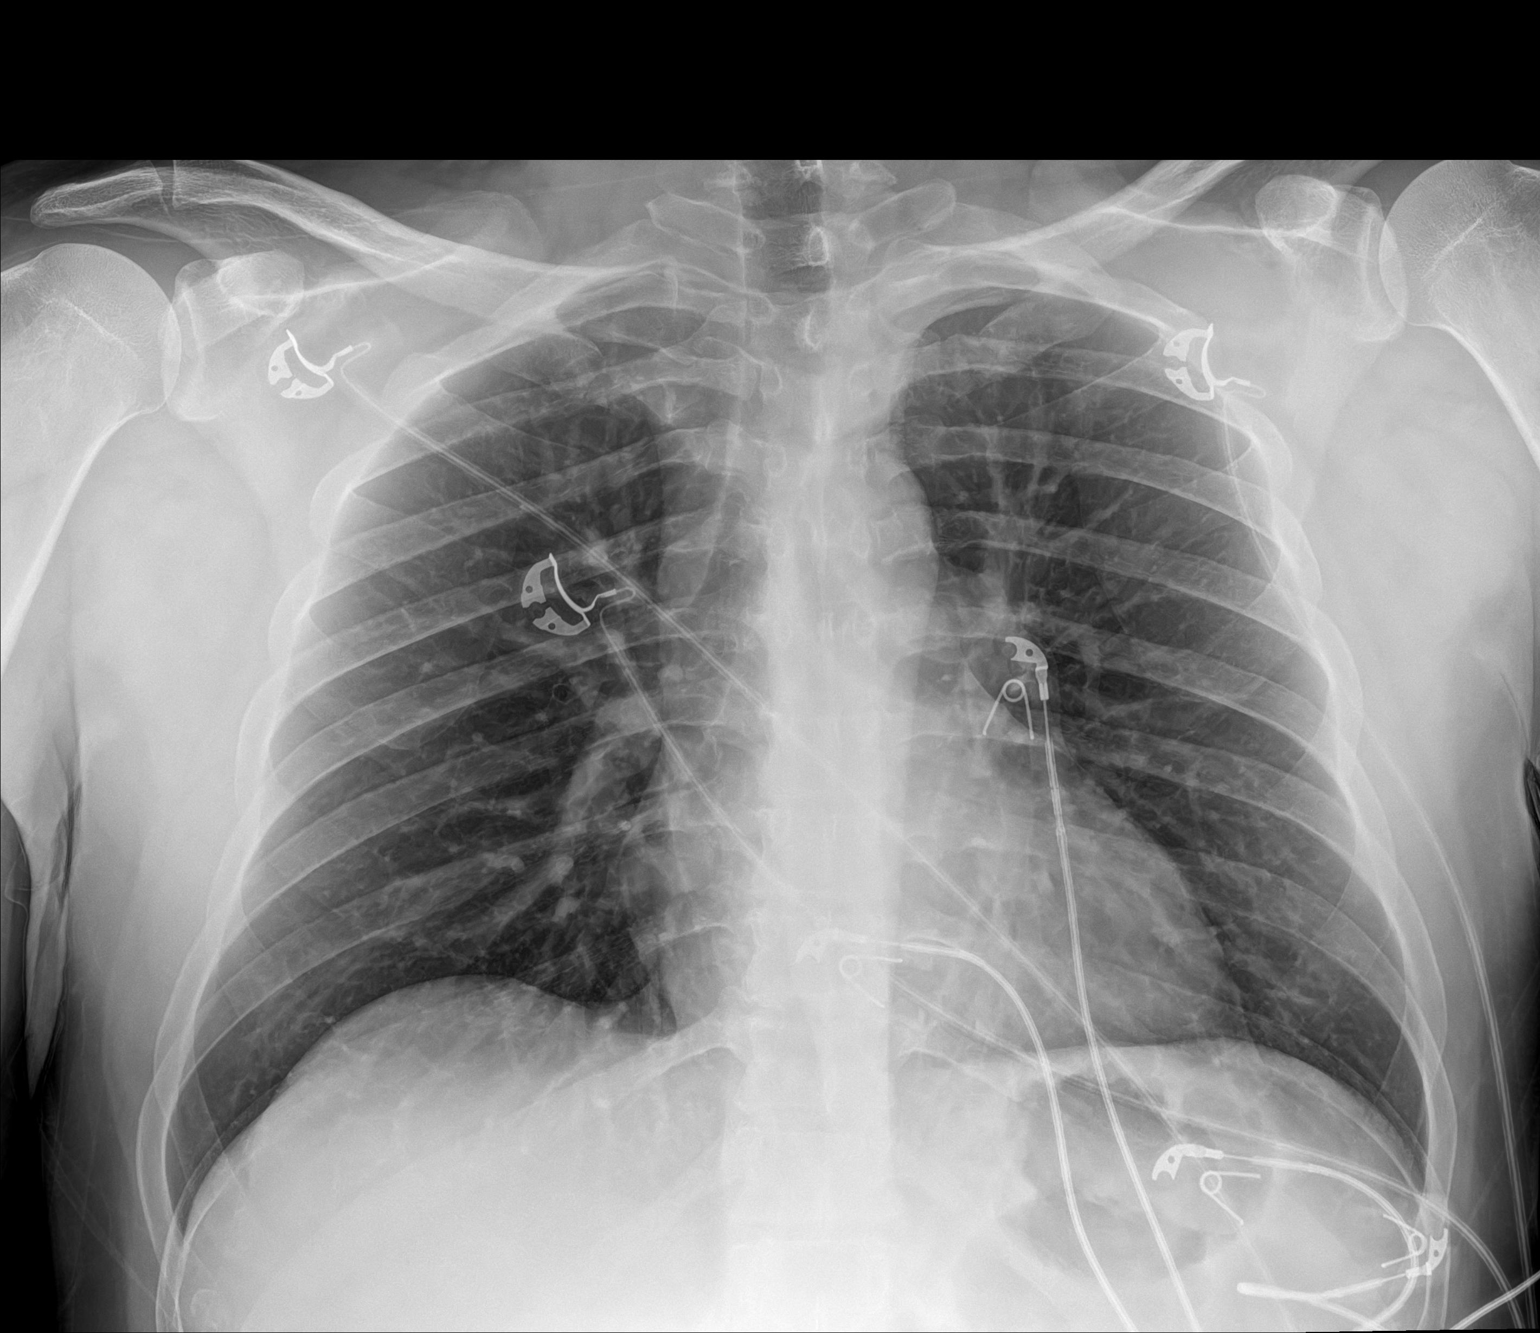

[1 of 1 positions shown; findings below may reference images not displayed]

FINDINGS: The heart size and mediastinal contours are within normal limits.
Both lungs are clear. The visualized skeletal structures are
unremarkable.
IMPRESSION: No active disease.
